# Patient Record
Sex: Male | Born: 1985 | Race: White | Hispanic: No | Marital: Single | Smoking: Current every day smoker
Health system: Southern US, Community
[De-identification: ages and names within clinical notes are randomized; demographics above are authoritative.]

## PROBLEM LIST (undated history)

## (undated) HISTORY — PX: TOOTH EXTRACTION: SUR596

## (undated) HISTORY — PX: DENTAL SURGERY: SHX609

---

## 2007-08-31 ENCOUNTER — Emergency Department (HOSPITAL_COMMUNITY): Admission: EM | Admit: 2007-08-31 | Discharge: 2007-08-31 | Payer: Self-pay | Admitting: Emergency Medicine

## 2008-05-17 ENCOUNTER — Emergency Department (HOSPITAL_COMMUNITY): Admission: EM | Admit: 2008-05-17 | Discharge: 2008-05-17 | Payer: Self-pay | Admitting: Family Medicine

## 2012-11-24 ENCOUNTER — Emergency Department (HOSPITAL_COMMUNITY)
Admission: EM | Admit: 2012-11-24 | Discharge: 2012-11-24 | Disposition: A | Discharge status: Home / Self Care | Payer: No Typology Code available for payment source | Attending: Emergency Medicine | Admitting: Emergency Medicine

## 2012-11-24 ENCOUNTER — Encounter (HOSPITAL_COMMUNITY): Payer: Self-pay | Admitting: *Deleted

## 2012-11-24 DIAGNOSIS — F17200 Nicotine dependence, unspecified, uncomplicated: Secondary | ICD-10-CM | POA: Insufficient documentation

## 2012-11-24 DIAGNOSIS — K047 Periapical abscess without sinus: Secondary | ICD-10-CM | POA: Insufficient documentation

## 2012-11-24 DIAGNOSIS — Z9889 Other specified postprocedural states: Secondary | ICD-10-CM | POA: Insufficient documentation

## 2012-11-24 DIAGNOSIS — K029 Dental caries, unspecified: Secondary | ICD-10-CM | POA: Insufficient documentation

## 2012-11-24 DIAGNOSIS — R220 Localized swelling, mass and lump, head: Secondary | ICD-10-CM | POA: Insufficient documentation

## 2012-11-24 DIAGNOSIS — Z79899 Other long term (current) drug therapy: Secondary | ICD-10-CM | POA: Insufficient documentation

## 2012-11-24 MED ORDER — PENICILLIN V POTASSIUM 500 MG PO TABS: 500.0000 mg | ORAL_TABLET | Freq: Four times a day (QID) | ORAL | Status: DC

## 2012-11-24 MED ORDER — HYDROCODONE-ACETAMINOPHEN 5-325 MG PO TABS: 1.0000 | ORAL_TABLET | ORAL | Status: DC | PRN

## 2012-11-24 NOTE — ED Notes (Signed)
Pt states hx of cracked teeth, along with surgeries to remove teeth. Pt states that he was eating a burrito on Friday and he believes that his tooth cracked and then went into his gum and started an abscess. Pt has swelling to right side of face.

## 2012-11-24 NOTE — ED Notes (Signed)
EDP at bedside  

## 2012-11-24 NOTE — ED Provider Notes (Signed)
History  This chart was scribed for non-physician practitioner working with Carleene Cooper III, MD. This patient was seen in room TR10C/TR10C and the patient's care was started at 8:09 PM.  CSN: [id number]  Arrival date & time 11/24/12  1938   Chief Complaint  Patient presents with  . Dental Pain  . Abscess    The history is provided by the patient. No language interpreter was used.   HPI Comments: Craig Chen is a 27 y.o. male with a hx of dental surgery who presents to the Emergency Department complaining of a suspected dental abscess with associated severe swelling and mild pain. Pt states that he cracked his tooth 2 days ago while eating a burrito. He states he believes that part of the the cracked tooth traveled up into his gums and started an abscess. Pt states that he has brittle teeth, and hx of cracked teeth and surgeries to remove cracked teeth. Pt describes associated pain as pressure, rated at a "1-2/10" severity. His swelling is alarming and gradually worsening. The right side of his face appears swollen from a distance. He states that he has used peroxide and rubbing alcohol with little relief of symptoms. He denies fever, chills, trouble breathing, drainage from the tooth, sore throat, cough, abdominal pain, nausea, vomiting, visual disturbance or any other symptoms. Pt is an occasional alcohol user and a current every day smoker.   History reviewed. No pertinent past medical history.  Past Surgical History  Procedure Laterality Date  . Dental surgery     History reviewed. No pertinent family history. History  Substance Use Topics  . Smoking status: Current Every Day Smoker  . Smokeless tobacco: Not on file  . Alcohol Use: Yes    Review of Systems  Constitutional: Negative for chills.  HENT: Positive for dental problem. Negative for sore throat.   Eyes: Negative for visual disturbance.  Respiratory: Negative for cough, shortness of breath and wheezing.    Gastrointestinal: Negative for abdominal pain.  All other systems reviewed and are negative.    Allergies  Review of patient's allergies indicates no known allergies.  Home Medications   Current Outpatient Rx  Name  Route  Sig  Dispense  Refill  . ferrous sulfate 325 (65 FE) MG tablet   Oral   Take 325 mg by mouth daily with breakfast.         . HYDROcodone-acetaminophen (NORCO/VICODIN) 5-325 MG per tablet   Oral   Take 1 tablet by mouth every 4 (four) hours as needed for pain.   10 tablet   0   . penicillin v potassium (VEETID) 500 MG tablet   Oral   Take 1 tablet (500 mg total) by mouth 4 (four) times daily.   40 tablet   0     Triage Vitals: BP 126/76  Pulse 83  Temp(Src) 98.8 F (37.1 C) (Oral)  Resp 16  SpO2 100%  Physical Exam  Constitutional: He is oriented to person, place, and time. He appears well-developed and well-nourished. No distress.  HENT:  Head: Normocephalic and atraumatic. No trismus in the jaw.  Mouth/Throat: Uvula is midline, oropharynx is clear and moist and mucous membranes are normal. Abnormal dentition. Dental abscesses and dental caries present. No edematous. No oropharyngeal exudate.  Right-sided facial swelling.  Eyes: Conjunctivae and EOM are normal. Pupils are equal, round, and reactive to light.  Neck: Neck supple.  Cardiovascular: Normal rate, regular rhythm and normal heart sounds.   Pulmonary/Chest: Effort normal and  breath sounds normal. No respiratory distress.  Neurological: He is alert and oriented to person, place, and time.  Skin: Skin is warm and dry. He is not diaphoretic.  Psychiatric: He has a normal mood and affect.    ED Course  Procedures (including critical care time)  DIAGNOSTIC STUDIES: Oxygen Saturation is 100% on RA, normal by my interpretation.    COORDINATION OF CARE: 8:14 PM- Pt advised of plan for discharge with pain medication and antibiotics and pt agrees. He states that he plans to follow-up  with his dentist to have the tooth removed and abscess treated.   Labs Reviewed - No data to display  No results found.  1. Dental abscess     MDM  Patient with toothache.  Abscess noted, but not amendable to drainage at this time.  Exam unconcerning for Ludwig's angina or PTA.  No signs of respiratory distress. VSS. Will treat with penicillin and pain medicine.  Urged patient to follow-up with dentist at his scheduled appointment tomorrow for further managment. Patient d/w with Dr. Ignacia Palma, agrees with plan. Patient is agreeable to plan. Patient is stable at time of discharge           I personally performed the services described in this documentation, which was scribed in my presence. The recorded information has been reviewed and is accurate.    Jeannetta Ellis, PA-C 11/24/12 2212

## 2012-11-25 NOTE — ED Provider Notes (Signed)
Medical screening examination/treatment/procedure(s) were conducted as a shared visit with non-physician practitioner(s) and myself.  I personally evaluated the patient during the encounter Pt with decayed right upper second bicuspid and right cheek swelling and redness.  Advised PenVK, pain meds, dental referral for dental extraction.  Carleene Cooper III, MD 11/25/12 [phone]

## 2015-06-05 DIAGNOSIS — K047 Periapical abscess without sinus: Secondary | ICD-10-CM | POA: Insufficient documentation

## 2015-06-05 DIAGNOSIS — K0889 Other specified disorders of teeth and supporting structures: Secondary | ICD-10-CM | POA: Diagnosis present

## 2015-06-05 DIAGNOSIS — Z79899 Other long term (current) drug therapy: Secondary | ICD-10-CM | POA: Diagnosis not present

## 2015-06-05 DIAGNOSIS — F17200 Nicotine dependence, unspecified, uncomplicated: Secondary | ICD-10-CM | POA: Insufficient documentation

## 2015-06-06 ENCOUNTER — Encounter (HOSPITAL_COMMUNITY): Payer: Self-pay | Admitting: *Deleted

## 2015-06-06 ENCOUNTER — Emergency Department (HOSPITAL_COMMUNITY)
Admission: EM | Admit: 2015-06-06 | Discharge: 2015-06-06 | Disposition: A | Discharge status: Home / Self Care | Payer: No Typology Code available for payment source | Attending: Emergency Medicine | Admitting: Emergency Medicine

## 2015-06-06 DIAGNOSIS — K047 Periapical abscess without sinus: Secondary | ICD-10-CM

## 2015-06-06 MED ORDER — HYDROCODONE-ACETAMINOPHEN 5-325 MG PO TABS: 1.0000 | ORAL_TABLET | Freq: Once | ORAL | Status: AC

## 2015-06-06 MED ORDER — AMOXICILLIN 500 MG PO CAPS: 1000.0000 mg | ORAL_CAPSULE | Freq: Two times a day (BID) | ORAL | Status: DC

## 2015-06-06 MED ORDER — ONDANSETRON 4 MG PO TBDP
8.0000 mg | ORAL_TABLET | Freq: Once | ORAL | Status: AC
  Administered 2015-06-06: 8 mg via ORAL
  Filled 2015-06-06: qty 2

## 2015-06-06 MED ORDER — AMOXICILLIN 500 MG PO CAPS
1000.0000 mg | ORAL_CAPSULE | Freq: Once | ORAL | Status: AC
  Administered 2015-06-06: 1000 mg via ORAL
  Filled 2015-06-06: qty 2

## 2015-06-06 MED ORDER — METOCLOPRAMIDE HCL 10 MG PO TABS: 10.0000 mg | ORAL_TABLET | Freq: Four times a day (QID) | ORAL | Status: DC | PRN

## 2015-06-06 MED ORDER — HYDROCODONE-ACETAMINOPHEN 5-325 MG PO TABS
ORAL_TABLET | ORAL | Status: AC
  Administered 2015-06-06: 1
  Filled 2015-06-06: qty 1

## 2015-06-06 MED ORDER — OXYCODONE-ACETAMINOPHEN 5-325 MG PO TABS
1.0000 | ORAL_TABLET | Freq: Once | ORAL | Status: AC
Start: 2015-06-06 — End: 2015-06-06
  Administered 2015-06-06: 1 via ORAL
  Filled 2015-06-06: qty 1

## 2015-06-06 MED ORDER — OXYCODONE-ACETAMINOPHEN 5-325 MG PO TABS: 1.0000 | ORAL_TABLET | ORAL | Status: DC | PRN

## 2015-06-06 NOTE — Discharge Instructions (Signed)
Dental Abscess A dental abscess is a collection of pus in or around a tooth. CAUSES This condition is caused by a bacterial infection around the root of the tooth that involves the inner part of the tooth (pulp). It may result from:  Severe tooth decay.  Trauma to the tooth that allows bacteria to enter into the pulp, such as a broken or chipped tooth.  Severe gum disease around a tooth. SYMPTOMS Symptoms of this condition include:  Severe pain in and around the infected tooth.  Swelling and redness around the infected tooth, in the mouth, or in the face.  Tenderness.  Pus drainage.  Bad breath.  Bitter taste in the mouth.  Difficulty swallowing.  Difficulty opening the mouth.  Nausea.  Vomiting.  Chills.  Swollen neck glands.  Fever. DIAGNOSIS This condition is diagnosed with examination of the infected tooth. During the exam, your dentist may tap on the infected tooth. Your dentist will also ask about your medical and dental history and may order X-rays. TREATMENT This condition is treated by eliminating the infection. This may be done with:  Antibiotic medicine.  A root canal. This may be performed to save the tooth.  Pulling (extracting) the tooth. This may also involve draining the abscess. This is done if the tooth cannot be saved. HOME CARE INSTRUCTIONS  Take medicines only as directed by your dentist.  If you were prescribed antibiotic medicine, finish all of it even if you start to feel better.  Rinse your mouth (gargle) often with salt water to relieve pain or swelling.  Do not drive or operate heavy machinery while taking pain medicine.  Do not apply heat to the outside of your mouth.  Keep all follow-up visits as directed by your dentist. This is important. SEEK MEDICAL CARE IF:  Your pain is worse and is not helped by medicine. SEEK IMMEDIATE MEDICAL CARE IF:  You have a fever or chills.  Your symptoms suddenly get worse.  You have a  very bad headache.  You have problems breathing or swallowing.  You have trouble opening your mouth.  You have swelling in your neck or around your eye.   This information is not intended to replace advice given to you by your health care provider. Make sure you discuss any questions you have with your health care provider.   Document Released: 05/01/2005 Document Revised: 09/15/2014 Document Reviewed: 04/28/2014 Elsevier Interactive Patient Education 2016 Elsevier Inc.  Amoxicillin capsules or tablets What is this medicine? AMOXICILLIN (a mox i SIL in) is a penicillin antibiotic. It is used to treat certain kinds of bacterial infections. It will not work for colds, flu, or other viral infections. This medicine may be used for other purposes; ask your health care provider or pharmacist if you have questions. What should I tell my health care provider before I take this medicine? They need to know if you have any of these conditions: -asthma -kidney disease -an unusual or allergic reaction to amoxicillin, other penicillins, cephalosporin antibiotics, other medicines, foods, dyes, or preservatives -pregnant or trying to get pregnant -breast-feeding How should I use this medicine? Take this medicine by mouth with a glass of water. Follow the directions on your prescription label. You may take this medicine with food or on an empty stomach. Take your medicine at regular intervals. Do not take your medicine more often than directed. Take all of your medicine as directed even if you think your are better. Do not skip doses or stop  your medicine early. Talk to your pediatrician regarding the use of this medicine in children. While this drug may be prescribed for selected conditions, precautions do apply. Overdosage: If you think you have taken too much of this medicine contact a poison control center or emergency room at once. NOTE: This medicine is only for you. Do not share this medicine with  others. What if I miss a dose? If you miss a dose, take it as soon as you can. If it is almost time for your next dose, take only that dose. Do not take double or extra doses. What may interact with this medicine? -amiloride -birth control pills -chloramphenicol -macrolides -probenecid -sulfonamides -tetracyclines This list may not describe all possible interactions. Give your health care provider a list of all the medicines, herbs, non-prescription drugs, or dietary supplements you use. Also tell them if you smoke, drink alcohol, or use illegal drugs. Some items may interact with your medicine. What should I watch for while using this medicine? Tell your doctor or health care professional if your symptoms do not improve in 2 or 3 days. Take all of the doses of your medicine as directed. Do not skip doses or stop your medicine early. If you are diabetic, you may get a false positive result for sugar in your urine with certain brands of urine tests. Check with your doctor. Do not treat diarrhea with over-the-counter products. Contact your doctor if you have diarrhea that lasts more than 2 days or if the diarrhea is severe and watery. What side effects may I notice from receiving this medicine? Side effects that you should report to your doctor or health care professional as soon as possible: -allergic reactions like skin rash, itching or hives, swelling of the face, lips, or tongue -breathing problems -dark urine -redness, blistering, peeling or loosening of the skin, including inside the mouth -seizures -severe or watery diarrhea -trouble passing urine or change in the amount of urine -unusual bleeding or bruising -unusually weak or tired -yellowing of the eyes or skin Side effects that usually do not require medical attention (report to your doctor or health care professional if they continue or are bothersome): -dizziness -headache -stomach upset -trouble sleeping This list may not  describe all possible side effects. Call your doctor for medical advice about side effects. You may report side effects to FDA at 1-800-FDA-1088. Where should I keep my medicine? Keep out of the reach of children. Store between 68 and 77 degrees F (20 and 25 degrees C). Keep bottle closed tightly. Throw away any unused medicine after the expiration date. NOTE: This sheet is a summary. It may not cover all possible information. If you have questions about this medicine, talk to your doctor, pharmacist, or health care provider.    2016, Elsevier/Gold Standard. (2007-07-23 14:10:59)  Acetaminophen; Oxycodone tablets What is this medicine? ACETAMINOPHEN; OXYCODONE (a set a MEE noe fen; ox i KOE done) is a pain reliever. It is used to treat moderate to severe pain. This medicine may be used for other purposes; ask your health care provider or pharmacist if you have questions. What should I tell my health care provider before I take this medicine? They need to know if you have any of these conditions: -brain tumor -Crohn's disease, inflammatory bowel disease, or ulcerative colitis -drug abuse or addiction -head injury -heart or circulation problems -if you often drink alcohol -kidney disease or problems going to the bathroom -liver disease -lung disease, asthma, or breathing problems -an  unusual or allergic reaction to acetaminophen, oxycodone, other opioid analgesics, other medicines, foods, dyes, or preservatives -pregnant or trying to get pregnant -breast-feeding How should I use this medicine? Take this medicine by mouth with a full glass of water. Follow the directions on the prescription label. You can take it with or without food. If it upsets your stomach, take it with food. Take your medicine at regular intervals. Do not take it more often than directed. Talk to your pediatrician regarding the use of this medicine in children. Special care may be needed. Patients over 11 years old  may have a stronger reaction and need a smaller dose. Overdosage: If you think you have taken too much of this medicine contact a poison control center or emergency room at once. NOTE: This medicine is only for you. Do not share this medicine with others. What if I miss a dose? If you miss a dose, take it as soon as you can. If it is almost time for your next dose, take only that dose. Do not take double or extra doses. What may interact with this medicine? -alcohol -antihistamines -barbiturates like amobarbital, butalbital, butabarbital, methohexital, pentobarbital, phenobarbital, thiopental, and secobarbital -benztropine -drugs for bladder problems like solifenacin, trospium, oxybutynin, tolterodine, hyoscyamine, and methscopolamine -drugs for breathing problems like ipratropium and tiotropium -drugs for certain stomach or intestine problems like propantheline, homatropine methylbromide, glycopyrrolate, atropine, belladonna, and dicyclomine -general anesthetics like etomidate, ketamine, nitrous oxide, propofol, desflurane, enflurane, halothane, isoflurane, and sevoflurane -medicines for depression, anxiety, or psychotic disturbances -medicines for sleep -muscle relaxants -naltrexone -narcotic medicines (opiates) for pain -phenothiazines like perphenazine, thioridazine, chlorpromazine, mesoridazine, fluphenazine, prochlorperazine, promazine, and trifluoperazine -scopolamine -tramadol -trihexyphenidyl This list may not describe all possible interactions. Give your health care provider a list of all the medicines, herbs, non-prescription drugs, or dietary supplements you use. Also tell them if you smoke, drink alcohol, or use illegal drugs. Some items may interact with your medicine. What should I watch for while using this medicine? Tell your doctor or health care professional if your pain does not go away, if it gets worse, or if you have new or a different type of pain. You may develop  tolerance to the medicine. Tolerance means that you will need a higher dose of the medication for pain relief. Tolerance is normal and is expected if you take this medicine for a long time. Do not suddenly stop taking your medicine because you may develop a severe reaction. Your body becomes used to the medicine. This does NOT mean you are addicted. Addiction is a behavior related to getting and using a drug for a non-medical reason. If you have pain, you have a medical reason to take pain medicine. Your doctor will tell you how much medicine to take. If your doctor wants you to stop the medicine, the dose will be slowly lowered over time to avoid any side effects. You may get drowsy or dizzy. Do not drive, use machinery, or do anything that needs mental alertness until you know how this medicine affects you. Do not stand or sit up quickly, especially if you are an older patient. This reduces the risk of dizzy or fainting spells. Alcohol may interfere with the effect of this medicine. Avoid alcoholic drinks. There are different types of narcotic medicines (opiates) for pain. If you take more than one type at the same time, you may have more side effects. Give your health care provider a list of all medicines you use. Your doctor will  tell you how much medicine to take. Do not take more medicine than directed. Call emergency for help if you have problems breathing. The medicine will cause constipation. Try to have a bowel movement at least every 2 to 3 days. If you do not have a bowel movement for 3 days, call your doctor or health care professional. Do not take Tylenol (acetaminophen) or medicines that have acetaminophen with this medicine. Too much acetaminophen can be very dangerous. Many nonprescription medicines contain acetaminophen. Always read the labels carefully to avoid taking more acetaminophen. What side effects may I notice from receiving this medicine? Side effects that you should report to your  doctor or health care professional as soon as possible: -allergic reactions like skin rash, itching or hives, swelling of the face, lips, or tongue -breathing difficulties, wheezing -confusion -light headedness or fainting spells -severe stomach pain -unusually weak or tired -yellowing of the skin or the whites of the eyes Side effects that usually do not require medical attention (report to your doctor or health care professional if they continue or are bothersome): -dizziness -drowsiness -nausea -vomiting This list may not describe all possible side effects. Call your doctor for medical advice about side effects. You may report side effects to FDA at 1-800-FDA-1088. Where should I keep my medicine? Keep out of the reach of children. This medicine can be abused. Keep your medicine in a safe place to protect it from theft. Do not share this medicine with anyone. Selling or giving away this medicine is dangerous and against the law. This medicine may cause accidental overdose and death if it taken by other adults, children, or pets. Mix any unused medicine with a substance like cat litter or coffee grounds. Then throw the medicine away in a sealed container like a sealed bag or a coffee can with a lid. Do not use the medicine after the expiration date. Store at room temperature between 20 and 25 degrees C (68 and 77 degrees F). NOTE: This sheet is a summary. It may not cover all possible information. If you have questions about this medicine, talk to your doctor, pharmacist, or health care provider.    2016, Elsevier/Gold Standard. (2014-04-01 15:18:46)  Metoclopramide tablets What is this medicine? METOCLOPRAMIDE (met oh kloe PRA mide) is used to treat the symptoms of gastroesophageal reflux disease (GERD) like heartburn. It is also used to treat people with slow emptying of the stomach and intestinal tract. This medicine may be used for other purposes; ask your health care provider or  pharmacist if you have questions. What should I tell my health care provider before I take this medicine? They need to know if you have any of these conditions: -breast cancer -depression -diabetes -heart failure -high blood pressure -kidney disease -liver disease -Parkinson's disease or a movement disorder -pheochromocytoma -seizures -stomach obstruction, bleeding, or perforation -an unusual or allergic reaction to metoclopramide, procainamide, sulfites, other medicines, foods, dyes, or preservatives -pregnant or trying to get pregnant -breast-feeding How should I use this medicine? Take this medicine by mouth with a glass of water. Follow the directions on the prescription label. Take this medicine on an empty stomach, about 30 minutes before eating. Take your doses at regular intervals. Do not take your medicine more often than directed. Do not stop taking except on the advice of your doctor or health care professional. A special MedGuide will be given to you by the pharmacist with each prescription and refill. Be sure to read this information carefully each  time. Talk to your pediatrician regarding the use of this medicine in children. Special care may be needed. Overdosage: If you think you have taken too much of this medicine contact a poison control center or emergency room at once. NOTE: This medicine is only for you. Do not share this medicine with others. What if I miss a dose? If you miss a dose, take it as soon as you can. If it is almost time for your next dose, take only that dose. Do not take double or extra doses. What may interact with this medicine? -acetaminophen -cyclosporine -digoxin -medicines for blood pressure -medicines for diabetes, including insulin -medicines for hay fever and other allergies -medicines for depression, especially an Monoamine Oxidase Inhibitor (MAOI) -medicines for Parkinson's disease, like levodopa -medicines for sleep or for  pain -tetracycline This list may not describe all possible interactions. Give your health care provider a list of all the medicines, herbs, non-prescription drugs, or dietary supplements you use. Also tell them if you smoke, drink alcohol, or use illegal drugs. Some items may interact with your medicine. What should I watch for while using this medicine? It may take a few weeks for your stomach condition to start to get better. However, do not take this medicine for longer than 12 weeks. The longer you take this medicine, and the more you take it, the greater your chances are of developing serious side effects. If you are an elderly patient, a male patient, or you have diabetes, you may be at an increased risk for side effects from this medicine. Contact your doctor immediately if you start having movements you cannot control such as lip smacking, rapid movements of the tongue, involuntary or uncontrollable movements of the eyes, head, arms and legs, or muscle twitches and spasms. Patients and their families should watch out for worsening depression or thoughts of suicide. Also watch out for any sudden or severe changes in feelings such as feeling anxious, agitated, panicky, irritable, hostile, aggressive, impulsive, severely restless, overly excited and hyperactive, or not being able to sleep. If this happens, especially at the beginning of treatment or after a change in dose, call your doctor. Do not treat yourself for high fever. Ask your doctor or health care professional for advice. You may get drowsy or dizzy. Do not drive, use machinery, or do anything that needs mental alertness until you know how this drug affects you. Do not stand or sit up quickly, especially if you are an older patient. This reduces the risk of dizzy or fainting spells. Alcohol can make you more drowsy and dizzy. Avoid alcoholic drinks. What side effects may I notice from receiving this medicine? Side effects that you should  report to your doctor or health care professional as soon as possible: -allergic reactions like skin rash, itching or hives, swelling of the face, lips, or tongue -abnormal production of milk in females -breast enlargement in both males and females -change in the way you walk -difficulty moving, speaking or swallowing -drooling, lip smacking, or rapid movements of the tongue -excessive sweating -fever -involuntary or uncontrollable movements of the eyes, head, arms and legs -irregular heartbeat or palpitations -muscle twitches and spasms -unusually weak or tired Side effects that usually do not require medical attention (report to your doctor or health care professional if they continue or are bothersome): -change in sex drive or performance -depressed mood -diarrhea -difficulty sleeping -headache -menstrual changes -restless or nervous This list may not describe all possible side effects. Call your  doctor for medical advice about side effects. You may report side effects to FDA at 1-800-FDA-1088. Where should I keep my medicine? Keep out of the reach of children. Store at room temperature between 20 and 25 degrees C (68 and 77 degrees F). Protect from light. Keep container tightly closed. Throw away any unused medicine after the expiration date. NOTE: This sheet is a summary. It may not cover all possible information. If you have questions about this medicine, talk to your doctor, pharmacist, or health care provider.    2016, Elsevier/Gold Standard. (2011-08-29 13:04:38)   Emergency Department Resource Guide  Dental Care: Organization         Address  Phone  Notes  [facility] [place] [phone] Accepts children up to age 47 who are enrolled in [place] or [place]; pregnant women with a Medicaid card; and children who have applied for Medicaid or [place] Health Choice, but were declined,  whose parents can pay a reduced fee at time of service.  [facility]  [address], [place] [phone] Accepts children up to age 46 who are enrolled in [place] or [place]; pregnant women with a Medicaid card; and children who have applied for Medicaid or [place] Health Choice, but were declined, whose parents can pay a reduced fee at time of service.  [place] [facility]  [place] [phone] Patients are seen by appointment only. Walk-ins are not accepted. [place] will see patients 35 years of age and older. Monday - Tuesday (8am-5pm) Most Wednesdays (8:30-5pm) $30 per visit, cash only  [facility]  [address], [facility] [phone] Patients are seen by appointment only. Walk-ins are not accepted. [place] will see patients 6 years of age and older. One Wednesday Evening (Monthly: Volunteer Based).  $30 per visit, cash only  [facility]  [phone] for adults; Children under age 84, call Graduate Pediatric Dentistry at [phone]. Children aged 82-14, please call [phone] to request a pediatric application.  Dental services are provided in all areas of dental care including fillings, crowns and bridges, complete and partial dentures, implants, gum treatment, root canals, and extractions. Preventive care is also provided. Treatment is provided to both adults and children. Patients are selected via a lottery and there is often a waiting list.   [facility] [address] [place]  [phone] [email or web address]   [facility] [address] [place], [place] [phone], Ext. [phone] Second and Fourth Thursday of each month, opens at 6:30 AM; Clinic ends at 9 AM.  Patients are seen on a first-come first-served basis, and a limited number are seen during each clinic.   [facility]  [address] [place], [place] [phone]   Eligibility Requirements You must have lived in [place], [place], or [place] counties for at least the last three months.   You cannot be eligible for state or federal sponsored [employer], including [employer], [place], or [employer].   You generally cannot be eligible for healthcare insurance through your employer.    How to apply: Eligibility screenings are held every Tuesday and Wednesday afternoon from 1:00 pm until 4:00 pm. You do not need an appointment for the interview!  [facility] [address]  [place], [place]   [place] Department  [place]  [place]  [phone]

## 2015-06-06 NOTE — ED Provider Notes (Signed)
CSN: [id number]     Arrival date & time 06/05/15  2341 History   First MD Initiated Contact with Patient 06/06/15 [phone]     Chief Complaint  Patient presents with  . Dental Pain     (Consider location/radiation/quality/duration/timing/severity/associated sxs/prior Treatment) Patient is a 30 y.o. male presenting with tooth pain. The history is provided by the patient.  Dental Pain He has been having dental pain for the last week with pain getting worse. Pain is primarily on the right side but he has had similar left side. Tonight, pain got much worse and seemed to spread to his malar area on the right. He started vomiting because of the pain. He states he has vomited 10 times. He denies fever or chills. He has had problems with dental abscesses in the past.   History reviewed. No pertinent past medical history. Past Surgical History  Procedure Laterality Date  . Dental surgery    . Tooth extraction     No family history on file. Social History  Substance Use Topics  . Smoking status: Current Every Day Smoker  . Smokeless tobacco: Never Used  . Alcohol Use: Yes    Review of Systems  All other systems reviewed and are negative.     Allergies  Review of patient's allergies indicates no known allergies.  Home Medications   Prior to Admission medications   Medication Sig Start Date End Date Taking? Authorizing Provider  ferrous sulfate 325 (65 FE) MG tablet Take 325 mg by mouth daily with breakfast.    Historical Provider, MD  HYDROcodone-acetaminophen (NORCO/VICODIN) 5-325 MG per tablet Take 1 tablet by mouth every 4 (four) hours as needed for pain. 11/24/12   Jennifer Piepenbrink, PA-C  penicillin v potassium (VEETID) 500 MG tablet Take 1 tablet (500 mg total) by mouth 4 (four) times daily. 11/24/12   Jennifer Piepenbrink, PA-C   BP 132/91 mmHg  Pulse 78  Temp(Src) 97.5 F (36.4 C) (Oral)  Resp 18  Wt 135 lb (61.236 kg)  SpO2 98% Physical Exam  Nursing note and vitals  reviewed.  30 year old male, resting comfortably and in no acute distress. Vital signs are significant for borderline hypertension. Oxygen saturation is 98%, which is normal. Head is normocephalic and atraumatic. PERRLA, EOMI. Oropharynx is clear. Dentition is very poor. Too small dental abscesses are noted by tooth #5 and tooth #14. Neck is nontender and supple without adenopathy or JVD. Back is nontender and there is no CVA tenderness. Lungs are clear without rales, wheezes, or rhonchi. Chest is nontender. Heart has regular rate and rhythm without murmur. Abdomen is soft, flat, nontender without masses or hepatosplenomegaly and peristalsis is normoactive. Extremities have no cyanosis or edema, full range of motion is present. Skin is warm and dry without rash. Neurologic: Mental status is normal, cranial nerves are intact, there are no motor or sensory deficits.  ED Course  Procedures (including critical care time)   MDM   Final diagnoses:  Dental abscess    Dental abscesses. He is discharged with prescriptions for amoxicillin and oxycodone-acetaminophen as well as metoclopramide for nausea. He is given the resource guide for dental access.    Dione Booze, MD 06/06/15 [phone]

## 2015-06-06 NOTE — ED Notes (Signed)
Patient presents with c/o dental abcess - pain is so bad ghe has been vomiting  Right side of mouth on the top is red swollen

## 2017-03-02 ENCOUNTER — Ambulatory Visit (HOSPITAL_COMMUNITY)
Admission: EM | Admit: 2017-03-02 | Discharge: 2017-03-02 | Disposition: A | Discharge status: Home / Self Care | Payer: PRIVATE HEALTH INSURANCE | Attending: Emergency Medicine | Admitting: Emergency Medicine

## 2017-03-02 ENCOUNTER — Encounter (HOSPITAL_COMMUNITY): Payer: Self-pay | Admitting: Emergency Medicine

## 2017-03-02 DIAGNOSIS — M545 Low back pain, unspecified: Secondary | ICD-10-CM

## 2017-03-02 DIAGNOSIS — S39012A Strain of muscle, fascia and tendon of lower back, initial encounter: Secondary | ICD-10-CM

## 2017-03-02 MED ORDER — NAPROXEN 500 MG PO TABS: 500.0000 mg | ORAL_TABLET | Freq: Two times a day (BID) | ORAL | 0 refills | Status: DC

## 2017-03-02 MED ORDER — CYCLOBENZAPRINE HCL 10 MG PO TABS: 10.0000 mg | ORAL_TABLET | Freq: Two times a day (BID) | ORAL | 0 refills | Status: DC | PRN
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
## 2017-03-08 ENCOUNTER — Emergency Department (HOSPITAL_COMMUNITY)
Admission: EM | Admit: 2017-03-08 | Discharge: 2017-03-08 | Disposition: A | Discharge status: Home / Self Care | Payer: No Typology Code available for payment source | Attending: Emergency Medicine | Admitting: Emergency Medicine

## 2017-03-08 ENCOUNTER — Encounter (HOSPITAL_COMMUNITY): Payer: Self-pay | Admitting: Emergency Medicine

## 2017-03-08 ENCOUNTER — Emergency Department (HOSPITAL_COMMUNITY): Payer: No Typology Code available for payment source

## 2017-03-08 DIAGNOSIS — S3992XA Unspecified injury of lower back, initial encounter: Secondary | ICD-10-CM | POA: Diagnosis present

## 2017-03-08 DIAGNOSIS — S39012A Strain of muscle, fascia and tendon of lower back, initial encounter: Secondary | ICD-10-CM | POA: Diagnosis not present

## 2017-03-08 DIAGNOSIS — Y929 Unspecified place or not applicable: Secondary | ICD-10-CM | POA: Diagnosis not present

## 2017-03-08 DIAGNOSIS — F17200 Nicotine dependence, unspecified, uncomplicated: Secondary | ICD-10-CM | POA: Insufficient documentation

## 2017-03-08 DIAGNOSIS — Y939 Activity, unspecified: Secondary | ICD-10-CM | POA: Insufficient documentation

## 2017-03-08 DIAGNOSIS — Z79899 Other long term (current) drug therapy: Secondary | ICD-10-CM | POA: Insufficient documentation

## 2017-03-08 DIAGNOSIS — T148XXA Other injury of unspecified body region, initial encounter: Secondary | ICD-10-CM

## 2017-03-08 DIAGNOSIS — Y999 Unspecified external cause status: Secondary | ICD-10-CM | POA: Insufficient documentation

## 2017-03-08 MED ORDER — KETOROLAC TROMETHAMINE 60 MG/2ML IM SOLN
60.0000 mg | Freq: Once | INTRAMUSCULAR | Status: AC
  Administered 2017-03-08: 60 mg via INTRAMUSCULAR
  Filled 2017-03-08: qty 2
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
## 2021-06-01 ENCOUNTER — Emergency Department (HOSPITAL_COMMUNITY): Payer: No Typology Code available for payment source

## 2021-06-01 ENCOUNTER — Other Ambulatory Visit: Payer: Self-pay

## 2021-06-01 ENCOUNTER — Emergency Department (HOSPITAL_COMMUNITY)
Admission: EM | Admit: 2021-06-01 | Discharge: 2021-06-01 | Disposition: A | Discharge status: Home / Self Care | Payer: No Typology Code available for payment source | Attending: Emergency Medicine | Admitting: Emergency Medicine

## 2021-06-01 DIAGNOSIS — S52502A Unspecified fracture of the lower end of left radius, initial encounter for closed fracture: Secondary | ICD-10-CM | POA: Insufficient documentation

## 2021-06-01 DIAGNOSIS — S52612G Displaced fracture of left ulna styloid process, subsequent encounter for closed fracture with delayed healing: Secondary | ICD-10-CM | POA: Insufficient documentation

## 2021-06-01 DIAGNOSIS — Y990 Civilian activity done for income or pay: Secondary | ICD-10-CM | POA: Insufficient documentation

## 2021-06-01 DIAGNOSIS — W1789XA Other fall from one level to another, initial encounter: Secondary | ICD-10-CM | POA: Insufficient documentation

## 2021-06-01 DIAGNOSIS — S6992XA Unspecified injury of left wrist, hand and finger(s), initial encounter: Secondary | ICD-10-CM | POA: Diagnosis present

## 2021-06-01 DIAGNOSIS — M25532 Pain in left wrist: Secondary | ICD-10-CM | POA: Diagnosis not present

## 2021-06-01 DIAGNOSIS — Z23 Encounter for immunization: Secondary | ICD-10-CM | POA: Insufficient documentation

## 2021-06-01 MED ORDER — HYDROCODONE-ACETAMINOPHEN 5-325 MG PO TABS: 1.0000 | ORAL_TABLET | Freq: Four times a day (QID) | ORAL | 0 refills | Status: DC | PRN

## 2021-06-01 MED ORDER — OXYCODONE-ACETAMINOPHEN 5-325 MG PO TABS
1.0000 | ORAL_TABLET | Freq: Once | ORAL | Status: AC
  Administered 2021-06-01: 1 via ORAL
  Filled 2021-06-01: qty 1

## 2021-06-01 MED ORDER — BACITRACIN ZINC 500 UNIT/GM EX OINT: TOPICAL_OINTMENT | Freq: Two times a day (BID) | CUTANEOUS | Status: DC

## 2021-06-01 MED ORDER — TETANUS-DIPHTH-ACELL PERTUSSIS 5-2.5-18.5 LF-MCG/0.5 IM SUSY
0.5000 mL | PREFILLED_SYRINGE | Freq: Once | INTRAMUSCULAR | Status: AC
  Administered 2021-06-01: 0.5 mL via INTRAMUSCULAR
  Filled 2021-06-01: qty 0.5
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
## 2021-06-01 NOTE — ED Provider Notes (Signed)
[facility] EMERGENCY DEPARTMENT Provider Note   CSN: [id number] Arrival date & time: 06/01/21  0510     History  No chief complaint on file.   Craig Chen is a 36 y.o. male history anxiety depression otherwise healthy presented today for evaluation of a wrist fracture.  Patient reports that he fell off of a piece equipment at work, he was about 2 feet off the ground, he fell onto an outstretched left hand.  He reports immediate left wrist pain it was moderate intensity aching/sharp, worsens with movement, improves with rest, pain does not radiate.  Patient reports that he went to [facility] urgent care and was told that he had a "broken wrist" he was put in a Velcro wrist brace.  Patient uncertain if this was the correct treatment so he came to the emergency department for further evaluation.  He denies any new injuries since that time.  Patient denies head injury, loss of consciousness, blood thinner use, headache, neck pain, back pain, chest pain, abdominal pain, pelvic pain, injury of the lower extremities, injury of the right upper extremity, left shoulder pain, left elbow pain or any additional concerns.  HPI     Home Medications Prior to Admission medications   Medication Sig Start Date End Date Taking? Authorizing Provider  HYDROcodone-acetaminophen (NORCO/VICODIN) 5-325 MG tablet Take 1 tablet by mouth every 6 (six) hours as needed for severe pain. 06/01/21  Yes Harlene Salts A, PA-C  ferrous sulfate 325 (65 FE) MG tablet Take 325 mg by mouth daily with breakfast.    [provider]      Allergies    Patient has no known allergies.    Review of Systems   Review of Systems  Constitutional: Negative.  Negative for chills and fever.  Cardiovascular: Negative.  Negative for chest pain.  Gastrointestinal: Negative.  Negative for abdominal pain, nausea and vomiting.  Musculoskeletal:  Positive for arthralgias. Negative for back pain, myalgias and  neck pain.  Neurological: Negative.  Negative for headaches.   Physical Exam Updated Vital Signs BP (!) 141/83 (BP Location: Right Arm)    Pulse 74    Temp 98.9 F (37.2 C) (Oral)    Resp 20    Ht 5\' 7"  (1.702 m)    Wt 70.8 kg    SpO2 99%    BMI 24.43 kg/m  Physical Exam Constitutional:      General: He is not in acute distress.    Appearance: Normal appearance. He is well-developed. He is not ill-appearing or diaphoretic.  HENT:     Head: Normocephalic and atraumatic.  Eyes:     General: Vision grossly intact. Gaze aligned appropriately.     Pupils: Pupils are equal, round, and reactive to light.  Neck:     Trachea: Trachea and phonation normal.  Cardiovascular:     Rate and Rhythm: Normal rate and regular rhythm.  Pulmonary:     Effort: Pulmonary effort is normal. No respiratory distress.  Chest:     Comments: Lungs clear to auscultation bilaterally.  No TTP of the chest wall. Abdominal:     General: There is no distension.     Palpations: Abdomen is soft.     Tenderness: There is no abdominal tenderness. There is no guarding or rebound.     Comments: Soft nontender atraumatic  Musculoskeletal:        General: Normal range of motion.     Cervical back: Normal range of motion.  Comments: No midline spinal tenderness palpation.  No crepitus step-off or deformity of the spine.  No paraspinal muscular tenderness palpation.  Pelvis is stable to compression without pain.  Patient able to bring bilateral knees to chest without pain.  Patient ambulates with steady gait without pain or difficulty.  Full ROM of the right upper extremity without pain or difficulty.  Full ROM of the bilateral lower extremities without pain or difficulty.  No pain with palpation or ROM of the left shoulder.  Patient is able to flex and extend at the left elbow without pain.  There is pain with supination and pronation due to distal radius/ulnar fracture, no pain at the elbow with those movements.  No bony  tenderness of the elbow on exam.  Left hand/wrist: Mild swelling overlying the dorsum of the wrist.  Very superficial abrasion over the dorsal aspect of the wrist, no deep tissue involvement.  Appears healing may precede patient's injury.  Otherwise hand and wrist appear unremarkable.  TTP of the distal radius.  No TTP of the fingers or of the hand.  No TTP at the proximal forearm or elbow.  No pain with ROM of the fingers.  Limitation with ROM of the wrist due to known fracture.  Radial pulse intact.  Capillary refill and sensation intact to all fingers.  Compartments soft.  Skin:    General: Skin is warm and dry.  Neurological:     Mental Status: He is alert.     GCS: GCS eye subscore is 4. GCS verbal subscore is 5. GCS motor subscore is 6.     Comments: Speech is clear and goal oriented, follows commands Major Cranial nerves without deficit, no facial droop Moves extremities without ataxia, coordination intact  Psychiatric:        Behavior: Behavior normal.    ED Results / Procedures / Treatments   Labs (all labs ordered are listed, but only abnormal results are displayed) Labs Reviewed - No data to display  EKG None  Radiology DG Forearm Left  Result Date: 06/01/2021 CLINICAL DATA:  Status post fall. EXAM: LEFT FOREARM - 2 VIEW COMPARISON:  None. FINDINGS: There is a comminuted intra-articular fracture involving the distal radius with impaction. Lateral and volar displacement of the distal fracture fragments noted. Nondisplaced ulnar styloid fracture. IMPRESSION: Comminuted intra-articular fracture of the distal radius with impaction and lateral and volar displacement of the distal fracture fragments. Electronically Signed   By: Signa Kellaylor  Stroud M.D.   On: 06/01/2021 06:38   DG Wrist Complete Left  Result Date: 06/01/2021 CLINICAL DATA:  Fall. EXAM: LEFT WRIST - COMPLETE 3+ VIEW COMPARISON:  None. FINDINGS: There is an acute, comminuted, impacted, intra-articular fracture deformity  involving the distal radius. There is mild lateral and volar displacement of the distal fracture fragments. Nondisplaced ulnar styloid fracture is also noted. IMPRESSION: 1. Comminuted, impacted, intra-articular fracture deformity involves the distal radius. 2. Nondisplaced ulnar styloid fracture. Electronically Signed   By: Signa Kellaylor  Stroud M.D.   On: 06/01/2021 06:37    Procedures Procedures    Medications Ordered in ED Medications  bacitracin ointment ( Topical Not Given 06/01/21 1000)  oxyCODONE-acetaminophen (PERCOCET/ROXICET) 5-325 MG per tablet 1 tablet (1 tablet Oral Given 06/01/21 0725)  Tdap (BOOSTRIX) injection 0.5 mL (0.5 mLs Intramuscular Given 06/01/21 0725)    ED Course/ Medical Decision Making/ A&P  Medical Decision Making Risk OTC drugs. Prescription drug management.   Additional history obtained from: Nursing notes from this visit.  I personally reviewed and interpreted patient's left forearm and left wrist x-ray which were obtained in triage.  I agree with the radiologist interpretation that patient has a slightly displaced distal radius fracture along with an ulnar styloid fracture.  I do not appreciate any other obvious fractures. ---- The patient arrived in a Velcro wrist brace today.  Given patient has a slightly displaced distal radius fracture along with ulnar styloid fracture we change this to a sugar-tong splint.  Sugar-tong splint was applied by Ortho technician, patient reports it is comfortable.  He remains neurovascular intact after splint was applied.  Additionally it was noticed that patient had a very superficial abrasion on the dorsal aspect of his wrist, does not appear to communicate any deep tissues and has some evidence of healing suspect this may have preceded his fall yesterday.  A small mount of antibiotic ointment was applied to the area, patient requested that his tetanus shot was updated today.  There is no indication for  oral antibiotics.  I discussed splint precautions with the patient and his wife, I advised RICE therapy.  A short course (6 pills) of Norco was prescribed for his acute wrist fracture.  I discussed narcotic precautions with the patient and he stated understanding.  Patient was asked to call the on-call hand specialist Dr. Melvyn Novas today to schedule follow-up appointment for further evaluation and treatment of his wrist fracture.  I consulted with on-call hand specialist Charma Igo, PA-C during this visit and reviewed patient's case and x-rays.  Charma Igo, PA-C agreed with a sugar-tong splint, no indication for reduction at this time.  This is a fall 2 feet off the ground, no injury of the head neck back chest abdomen or other extremities.  No additional work-up indicated at this time.  At this time there does not appear to be any evidence of an acute emergency medical condition and the patient appears stable for discharge with appropriate outpatient follow up. Diagnosis was discussed with patient who verbalizes understanding of care plan and is agreeable to discharge. I have discussed return precautions with patient and wife who verbalizes understanding. Patient encouraged to follow-up with their PCP and hand specialist. All questions answered.  I reviewed [place] narcotic database, no prior prescriptions.   Note: Portions of this report may have been transcribed using voice recognition software. Every effort was made to ensure accuracy; however, inadvertent computerized transcription errors may still be present.         Final Clinical Impression(s) / ED Diagnoses Final diagnoses:  Closed fracture of distal end of left radius, unspecified fracture morphology, initial encounter  Closed displaced fracture of styloid process of left ulna with delayed healing, subsequent encounter    Rx / DC Orders ED Discharge Orders          Ordered    HYDROcodone-acetaminophen  (NORCO/VICODIN) 5-325 MG tablet  Every 6 hours PRN        06/01/21 1022              Elizabeth Palau 06/01/21 1025    Bethann Berkshire, MD 06/02/21 1008

## 2021-06-01 NOTE — Progress Notes (Signed)
Orthopedic Tech Progress Note Patient Details:  Craig Chen Dec 05, 1985 [id number]  Ortho Devices Type of Ortho Device: Sugartong splint, Arm sling Ortho Device/Splint Location: LUE Ortho Device/Splint Interventions: Ordered, Application, Adjustment   Post Interventions Patient Tolerated: Well Instructions Provided: Care of device  Donald Pore 06/01/2021, 9:27 AM

## 2021-06-01 NOTE — Discharge Instructions (Addendum)
At this time there does not appear to be the presence of an emergent medical condition, however there is always the potential for conditions to change. Please read and follow the below instructions.  Please return to the Emergency Department immediately for any new or worsening symptoms. Please be sure to follow up with your Primary Care Provider within one week regarding your visit today; please call their office to schedule an appointment even if you are feeling better for a follow-up visit. Please call the hand specialist Dr. Melvyn Novas today to schedule a follow-up appointment for further evaluation and treatment of your broken wrist. You may take the medication Norco (Hydrocodone/Acetaminophen) as prescribed to help with severe pain.  This medication will make you drowsy so do not drive, drink alcohol, take other sedating medications or perform any dangerous activities such as driving after taking Norco. Norco contains Tylenol (acetaminophen) so do not take any other Tylenol-containing products with Norco. Please use rest ice and elevation to help with your pain.  You may use over-the-counter ibuprofen as directed on the packaging to help with your symptoms.  If your splint gets dirty or wet or is painful you will need to get it replaced, it can be replaced at Dr. Glenna Durand office or at an urgent care.  If you are unable to go to an urgent care to get your splint replaced you may return to the emergency department at any time.  Go to the nearest Emergency Department immediately if: You have fever or chills Your skin or fingers on your injured arm turn blue or gray. Your arm feels cold or gets numb. You have very bad pain in your injured wrist. You have any new/concerning or worsening of symptoms.   Please read the additional information packets attached to your discharge summary.  Do not take your medicine if  develop an itchy rash, swelling in your mouth or lips, or difficulty breathing; call  [phone] and seek immediate emergency medical attention if this occurs.  You may review your lab tests and imaging results in their entirety on your MyChart account.  Please discuss all results of fully with your primary care provider and other specialist at your follow-up visit.  Note: Portions of this text may have been transcribed using voice recognition software. Every effort was made to ensure accuracy; however, inadvertent computerized transcription errors may still be present.

## 2021-09-22 ENCOUNTER — Other Ambulatory Visit: Payer: Self-pay

## 2021-09-22 ENCOUNTER — Encounter (HOSPITAL_COMMUNITY): Payer: Self-pay | Admitting: Emergency Medicine

## 2021-09-22 ENCOUNTER — Emergency Department (HOSPITAL_COMMUNITY)
Admission: EM | Admit: 2021-09-22 | Discharge: 2021-09-22 | Disposition: A | Discharge status: Home / Self Care | Payer: No Typology Code available for payment source | Attending: Emergency Medicine | Admitting: Emergency Medicine

## 2021-09-22 DIAGNOSIS — Y92410 Unspecified street and highway as the place of occurrence of the external cause: Secondary | ICD-10-CM | POA: Insufficient documentation

## 2021-09-22 DIAGNOSIS — M7918 Myalgia, other site: Secondary | ICD-10-CM | POA: Insufficient documentation

## 2021-09-22 DIAGNOSIS — M546 Pain in thoracic spine: Secondary | ICD-10-CM | POA: Insufficient documentation

## 2021-09-22 MED ORDER — HYDROCODONE-ACETAMINOPHEN 5-325 MG PO TABS: 1.0000 | ORAL_TABLET | ORAL | 0 refills | Status: AC | PRN
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
## 2021-09-22 NOTE — ED Notes (Signed)
Patient verbalizes understanding of discharge instructions. Opportunity for questioning and answers were provided. Armband removed by staff, pt discharged from ED ambulatory.   

## 2021-09-22 NOTE — ED Notes (Signed)
Pt ambulatory to room from waiting room.

## 2021-09-22 NOTE — Discharge Instructions (Signed)
Use ice on the sore area 3-4 times a day, for 2 days, after that use heat.  We sent a prescription for pain reliever to your pharmacy.  Do not drive or drink alcohol when using it.  Follow-up with the doctor of your choice if not better in 3 to 4 days. ?

## 2023-04-29 IMAGING — CR DG WRIST COMPLETE 3+V*L*
3 series · 3 of 3 positions shown · non-contrast
Comparison: None.

CLINICAL DATA: Fall.

EXAM:
LEFT WRIST - COMPLETE 3+ VIEW

[wrist pa]
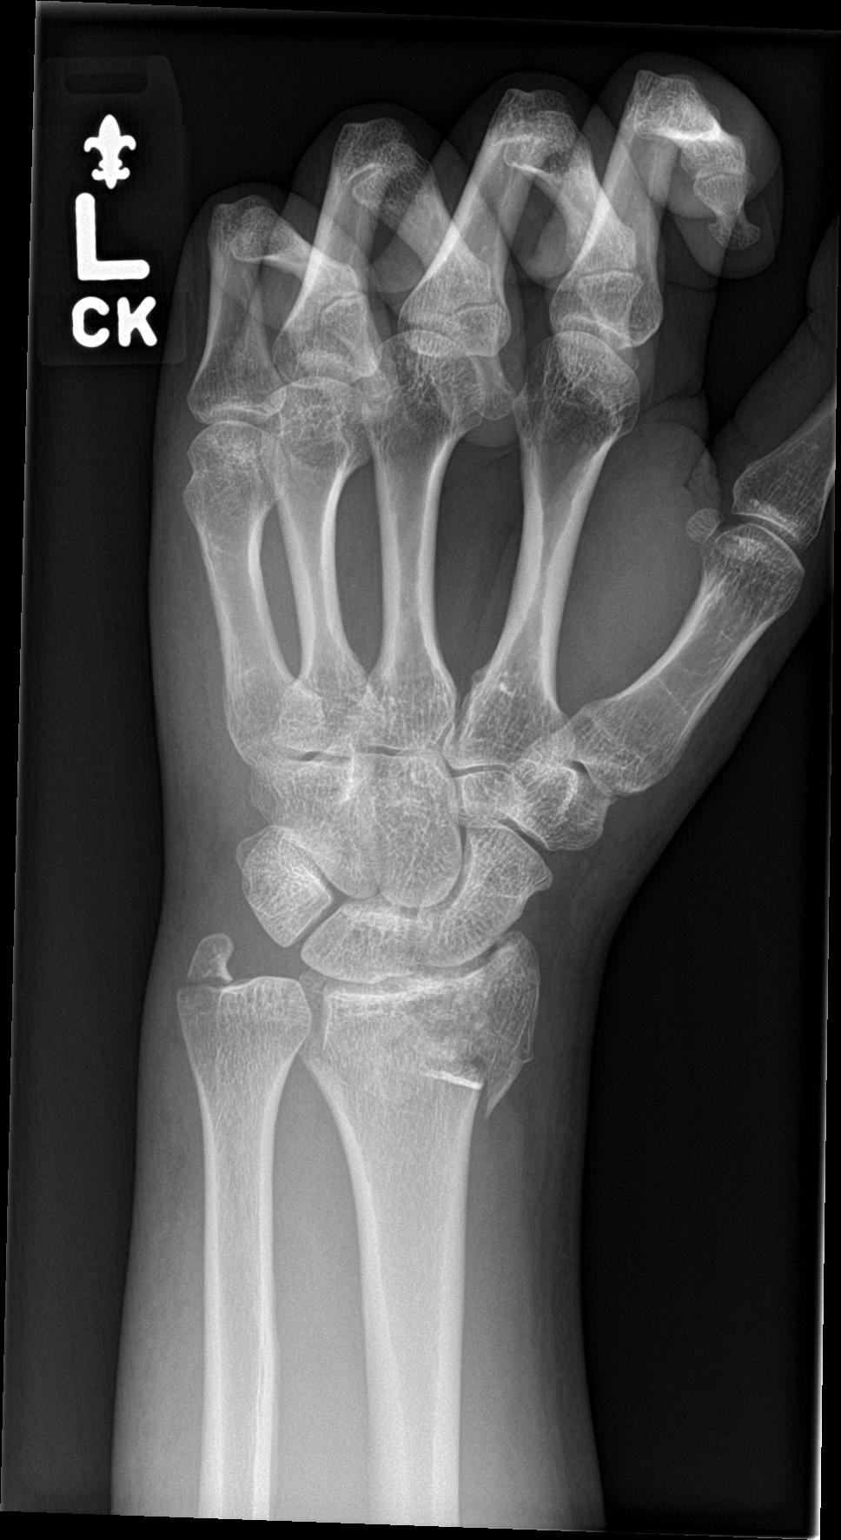

[wrist obl]
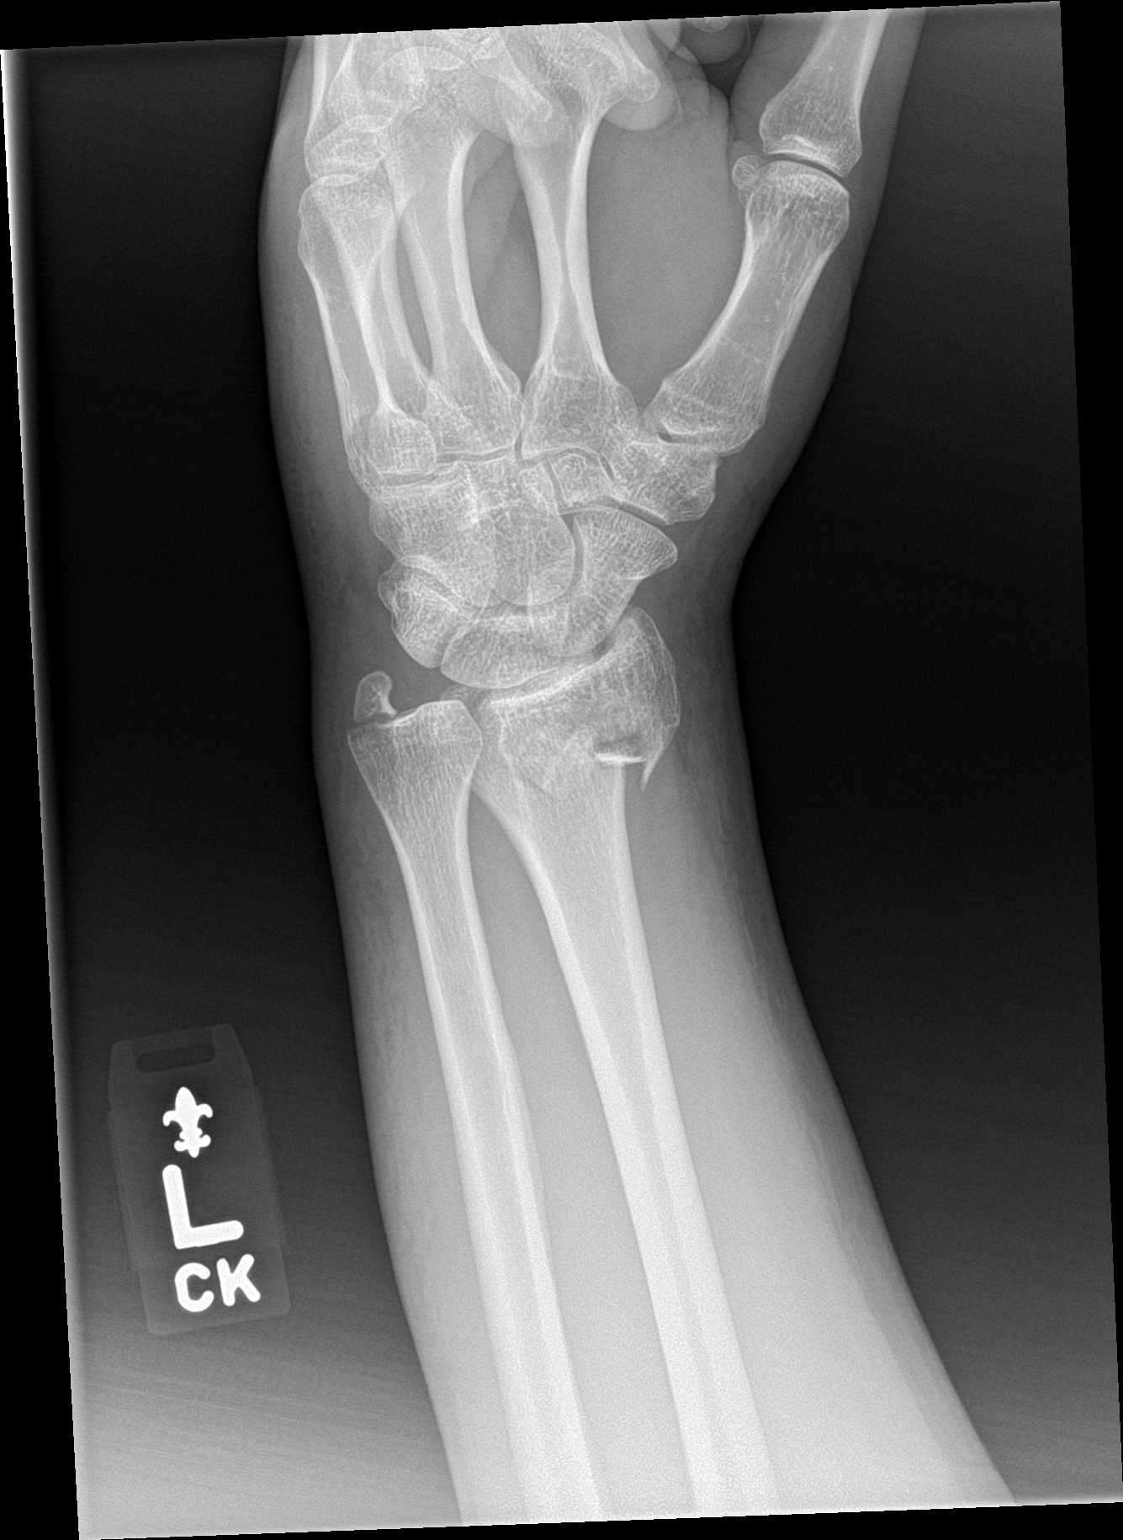

[wrist lat]
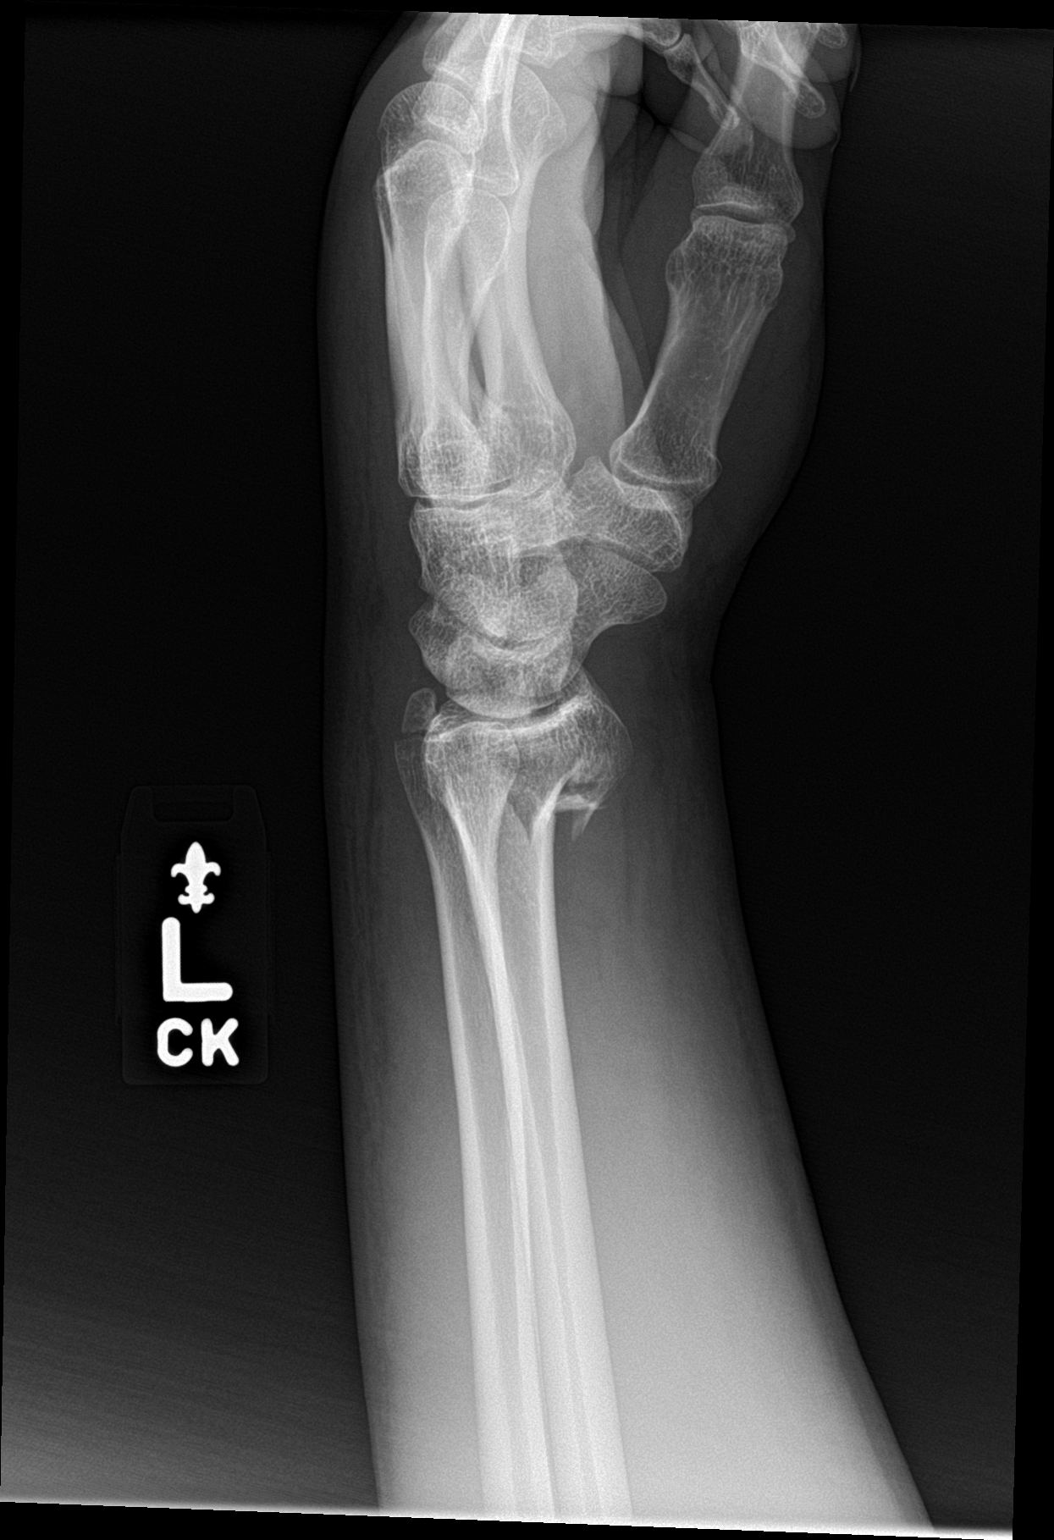

[3 of 3 positions shown; findings below may reference images not displayed]

FINDINGS: There is an acute, comminuted, impacted, intra-articular fracture
deformity involving the distal radius. There is mild lateral and
volar displacement of the distal fracture fragments. Nondisplaced
ulnar styloid fracture is also noted.
IMPRESSION: 1. Comminuted, impacted, intra-articular fracture deformity involves
the distal radius.
2. Nondisplaced ulnar styloid fracture.

## 2023-06-15 ENCOUNTER — Ambulatory Visit
Admission: RE | Admit: 2023-06-15 | Discharge: 2023-06-15 | Disposition: A | Discharge status: Home / Self Care | Payer: Self-pay | Source: Ambulatory Visit | Attending: Internal Medicine

## 2023-06-15 VITALS — BP 116/78 | HR 114 | Temp 99.5°F | Resp 20

## 2023-06-15 DIAGNOSIS — J069 Acute upper respiratory infection, unspecified: Secondary | ICD-10-CM | POA: Insufficient documentation

## 2023-06-15 DIAGNOSIS — Z720 Tobacco use: Secondary | ICD-10-CM | POA: Insufficient documentation

## 2023-06-15 LAB — POCT INFLUENZA A/B
Influenza A, POC: NEGATIVE
Influenza B, POC: NEGATIVE

## 2023-06-15 MED ORDER — PROMETHAZINE-DM 6.25-15 MG/5ML PO SYRP: 5.0000 mL | ORAL_SOLUTION | Freq: Every evening | ORAL | 0 refills | Status: AC | PRN

## 2023-06-15 MED ORDER — ACETAMINOPHEN 325 MG PO TABS
975.0000 mg | ORAL_TABLET | Freq: Once | ORAL | Status: AC
  Administered 2023-06-15: 975 mg via ORAL

## 2023-06-15 NOTE — Discharge Instructions (Signed)
 You have a viral illness which will improve on its own with rest, fluids, and medications to help with your symptoms.  Tylenol, guaifenesin (plain mucinex), and saline nasal sprays may help relieve symptoms.  Promethazine DM at bedtime as needed for cough.   Two teaspoons of honey in 1 cup of warm water every 4-6 hours may help with throat pains.  Humidifier in room at nighttime may help soothe cough (clean well daily).   For chest pain, shortness of breath, inability to keep food or fluids down without vomiting, fever that does not respond to tylenol or motrin, or any other severe symptoms, please go to the ER for further evaluation. Return to urgent care as needed, otherwise follow-up with PCP.

## 2023-06-15 NOTE — ED Provider Notes (Signed)
[facility]    CSN: [id number] Arrival date & time: 06/15/23  1109      History   Chief Complaint Chief Complaint  Patient presents with   Fever    I believe I have the flu or Covid/RSV. Had a fever of 100.4, took ibuprofen it came down- fever, chills, cough, sore throat, headache, cough, congestion, body aches. Fatigue, GF has same symptoms - Entered by patient    HPI Craig Chen is a 38 y.o. male.   Patient presents to urgent care for evaluation of cough, fever, chills, nasal congestion, and bodyaches that started abruptly yesterday.  His significant other has been sick with similar symptoms as well.  Cough is minimally productive.  He denies nausea, vomiting, diarrhea, abdominal pain, rash, and dizziness.  Denies chest pain, shortness of breath, and heart palpitations.  Max temp at home this morning was 100.8.  History of asthma as a child, states he "grew out of this".  He is a current every day cigarette smoker and denies other drug use/tobacco use.  He took ibuprofen at 2 AM with some relief of fever.    Fever   History reviewed. No pertinent past medical history.  There are no active problems to display for this patient.   Past Surgical History:  Procedure Laterality Date   DENTAL SURGERY     TOOTH EXTRACTION         Home Medications    Prior to Admission medications   Medication Sig Start Date End Date Taking? Authorizing Provider  promethazine-dextromethorphan (PROMETHAZINE-DM) 6.25-15 MG/5ML syrup Take 5 mLs by mouth at bedtime as needed for cough. 06/15/23  Yes Carlisle Beers, FNP  ferrous sulfate 325 (65 FE) MG tablet Take 325 mg by mouth daily with breakfast.    [provider]  HYDROcodone-acetaminophen (NORCO) 5-325 MG tablet Take 1 tablet by mouth every 4 (four) hours as needed. 09/22/21   Mancel Bale, MD    Family History History reviewed. No pertinent family history.  Social History Social History   Tobacco Use    Smoking status: Every Day   Smokeless tobacco: Never  Substance Use Topics   Alcohol use: Yes   Drug use: No     Allergies   Patient has no known allergies.   Review of Systems Review of Systems  Constitutional:  Positive for fever.  Per HPI   Physical Exam Triage Vital Signs ED Triage Vitals  Encounter Vitals Group     BP 06/15/23 1126 116/78     Systolic BP Percentile --      Diastolic BP Percentile --      Pulse Rate 06/15/23 1126 (!) 114     Resp 06/15/23 1126 20     Temp 06/15/23 1126 99.5 F (37.5 C)     Temp Source 06/15/23 1126 Oral     SpO2 06/15/23 1126 97 %     Weight --      Height --      Head Circumference --      Peak Flow --      Pain Score 06/15/23 1132 6     Pain Loc --      Pain Education --      Exclude from Growth Chart --    No data found.  Updated Vital Signs BP 116/78 (BP Location: Right Arm)   Pulse (!) 114   Temp 99.5 F (37.5 C) (Oral)   Resp 20   SpO2 97%   Visual  Acuity Right Eye Distance:   Left Eye Distance:   Bilateral Distance:    Right Eye Near:   Left Eye Near:    Bilateral Near:     Physical Exam Vitals and nursing note reviewed.  Constitutional:      Appearance: He is not ill-appearing or toxic-appearing.  HENT:     Head: Normocephalic and atraumatic.     Right Ear: Hearing, tympanic membrane, ear canal and external ear normal.     Left Ear: Hearing, tympanic membrane, ear canal and external ear normal.     Nose: Congestion present.     Mouth/Throat:     Lips: Pink.     Mouth: Mucous membranes are moist. No injury or oral lesions.     Dentition: Normal dentition.     Tongue: No lesions.     Pharynx: Oropharynx is clear. Uvula midline. No pharyngeal swelling, oropharyngeal exudate, posterior oropharyngeal erythema, uvula swelling or postnasal drip.     Tonsils: No tonsillar exudate.  Eyes:     General: Lids are normal. Vision grossly intact. Gaze aligned appropriately.     Extraocular Movements:  Extraocular movements intact.     Conjunctiva/sclera: Conjunctivae normal.  Neck:     Trachea: Trachea and phonation normal.  Cardiovascular:     Rate and Rhythm: Normal rate and regular rhythm.     Heart sounds: Normal heart sounds, S1 normal and S2 normal.  Pulmonary:     Effort: Pulmonary effort is normal. No respiratory distress.     Breath sounds: Normal breath sounds and air entry. No wheezing, rhonchi or rales.  Chest:     Chest wall: No tenderness.  Musculoskeletal:     Cervical back: Neck supple.  Lymphadenopathy:     Cervical: No cervical adenopathy.  Skin:    General: Skin is warm and dry.     Capillary Refill: Capillary refill takes less than 2 seconds.     Findings: No rash.  Neurological:     General: No focal deficit present.     Mental Status: He is alert and oriented to person, place, and time. Mental status is at baseline.     Cranial Nerves: No dysarthria or facial asymmetry.  Psychiatric:        Mood and Affect: Mood normal.        Speech: Speech normal.        Behavior: Behavior normal.        Thought Content: Thought content normal.        Judgment: Judgment normal.      UC Treatments / Results  Labs (all labs ordered are listed, but only abnormal results are displayed) Labs Reviewed  SARS CORONAVIRUS 2 (TAT 6-24 HRS)  POCT INFLUENZA A/B    EKG   Radiology No results found.  Procedures Procedures (including critical care time)  Medications Ordered in UC Medications  acetaminophen (TYLENOL) tablet 975 mg (975 mg Oral Given 06/15/23 1144)    Initial Impression / Assessment and Plan / UC Course  I have reviewed the triage vital signs and the nursing notes.  Pertinent labs & imaging results that were available during my care of the patient were reviewed by me and considered in my medical decision making (see chart for details).   1. Viral URI with cough Suspect viral URI, viral syndrome.  Strep/viral testing: POC flu testing negative,  PCR COVID pending. He is a candidate for antiviral if positive.   Physical exam findings reassuring, vital signs hemodynamically stable, and  lungs clear, therefore deferred imaging of the chest.  Advised supportive care/prescriptions for symptomatic relief as outlined in AVS.    Work note given.  Counseled patient on potential for adverse effects with medications prescribed/recommended today, strict ER and return-to-clinic precautions discussed, patient verbalized understanding.    Final Clinical Impressions(s) / UC Diagnoses   Final diagnoses:  Viral URI with cough  Nicotine abuse     Discharge Instructions      You have a viral illness which will improve on its own with rest, fluids, and medications to help with your symptoms.  Tylenol, guaifenesin (plain mucinex), and saline nasal sprays may help relieve symptoms.  Promethazine DM at bedtime as needed for cough.   Two teaspoons of honey in 1 cup of warm water every 4-6 hours may help with throat pains.  Humidifier in room at nighttime may help soothe cough (clean well daily).   For chest pain, shortness of breath, inability to keep food or fluids down without vomiting, fever that does not respond to tylenol or motrin, or any other severe symptoms, please go to the ER for further evaluation. Return to urgent care as needed, otherwise follow-up with PCP.       ED Prescriptions     Medication Sig Dispense Auth. Provider   promethazine-dextromethorphan (PROMETHAZINE-DM) 6.25-15 MG/5ML syrup Take 5 mLs by mouth at bedtime as needed for cough. 118 mL Carlisle Beers, FNP      PDMP not reviewed this encounter.   Carlisle Beers, [place] 06/15/23 1154

## 2023-06-15 NOTE — ED Triage Notes (Signed)
PT c/o cough, aches, chills, fever, congestion for 1 day. Here with significant other who was sick first.

## 2023-06-16 LAB — SARS CORONAVIRUS 2 (TAT 6-24 HRS): SARS Coronavirus 2: NEGATIVE

## 2024-03-21 ENCOUNTER — Ambulatory Visit: Payer: Self-pay
